# Patient Record
Sex: Female | Born: 1995 | Race: White | Hispanic: No | Marital: Single | Smoking: Never smoker
Health system: Southern US, Community
[De-identification: ages and names within clinical notes are randomized; demographics above are authoritative.]

## PROBLEM LIST (undated history)

## (undated) DIAGNOSIS — S43006A Unspecified dislocation of unspecified shoulder joint, initial encounter: Secondary | ICD-10-CM

## (undated) DIAGNOSIS — I493 Ventricular premature depolarization: Secondary | ICD-10-CM

## (undated) HISTORY — DX: Unspecified dislocation of unspecified shoulder joint, initial encounter: S43.006A

## (undated) HISTORY — PX: OTHER SURGICAL HISTORY: SHX169

---
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
## 2015-04-16 DIAGNOSIS — S43006A Unspecified dislocation of unspecified shoulder joint, initial encounter: Secondary | ICD-10-CM

## 2015-04-16 HISTORY — DX: Unspecified dislocation of unspecified shoulder joint, initial encounter: S43.006A

## 2015-09-16 IMAGING — CR RIGHT FOOT COMPLETE - 3+ VIEW
1 series · 4 of 4 positions shown · non-contrast
Comparison: None.

CLINICAL DATA: Pain in the ball the first MCP joint.

EXAM:
RIGHT FOOT COMPLETE - 3+ VIEW

[Series 1: ap · 0.17mm/px · 4 of 4 slices shown]
[im 1/4]
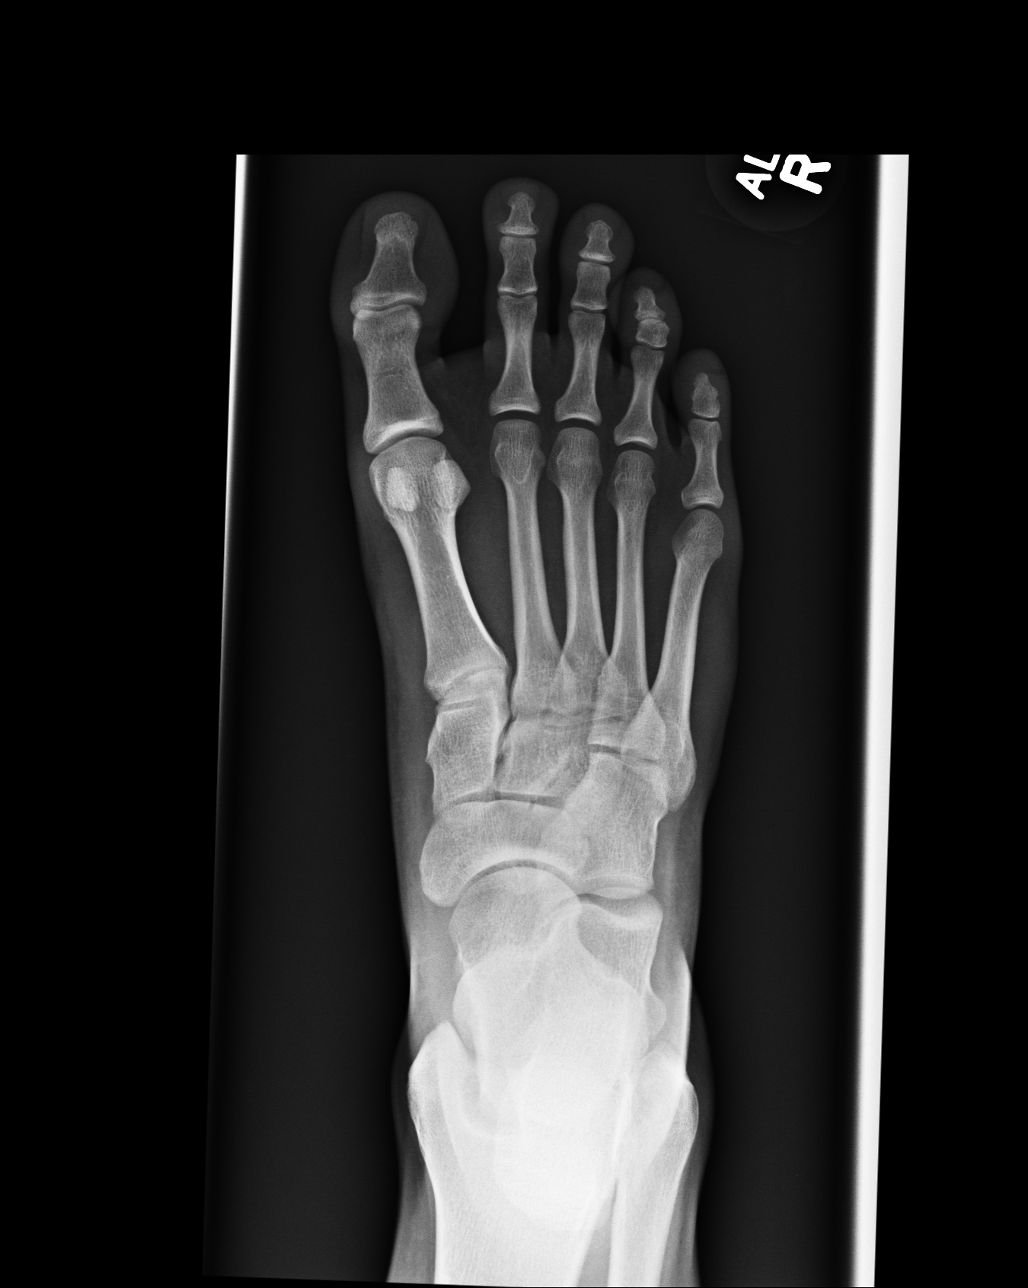
[im 2/4]
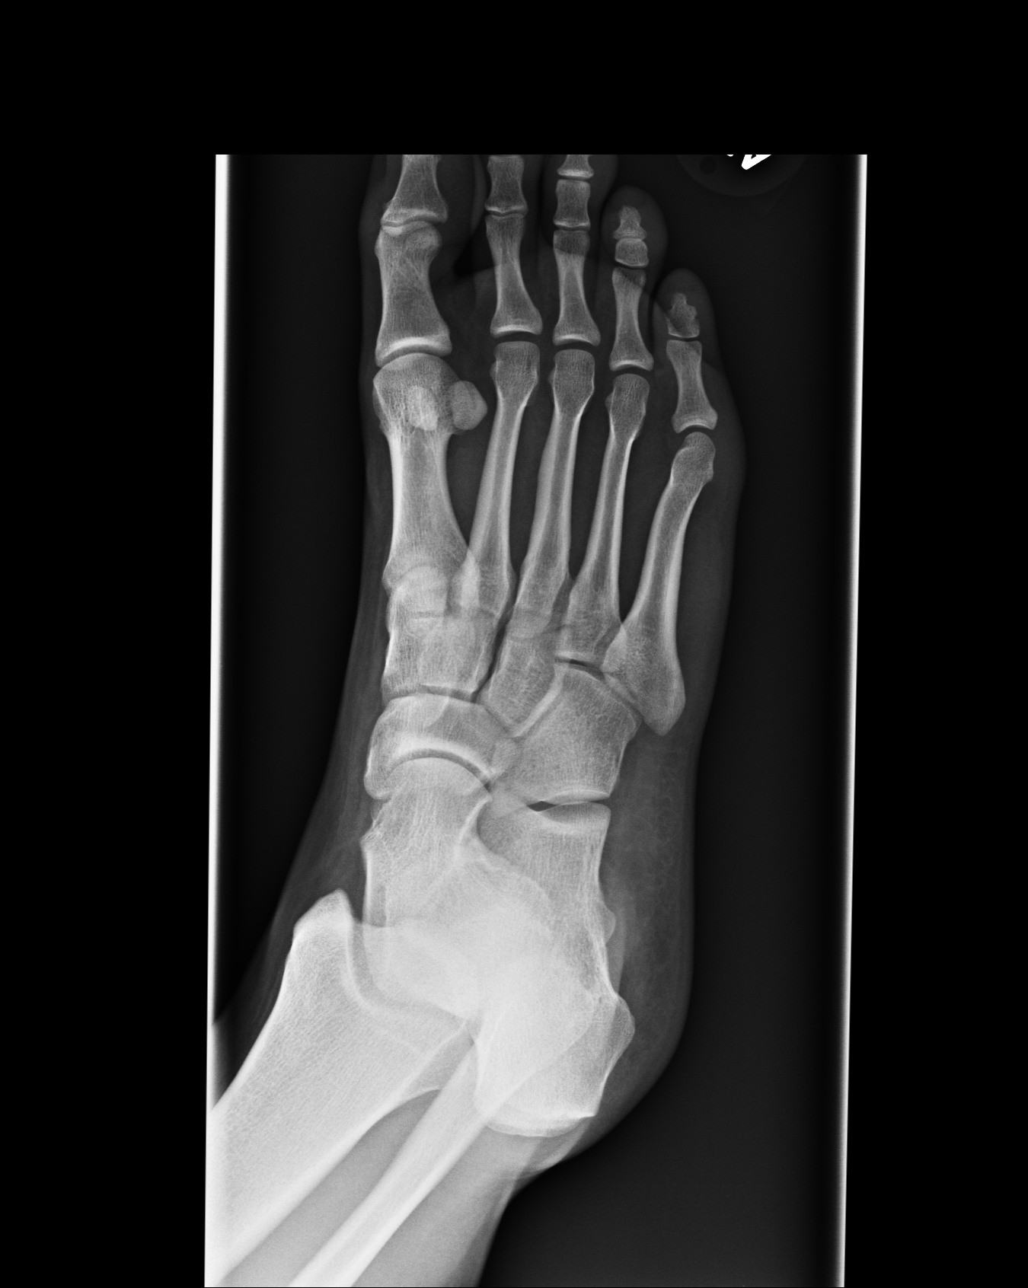
[im 3/4]
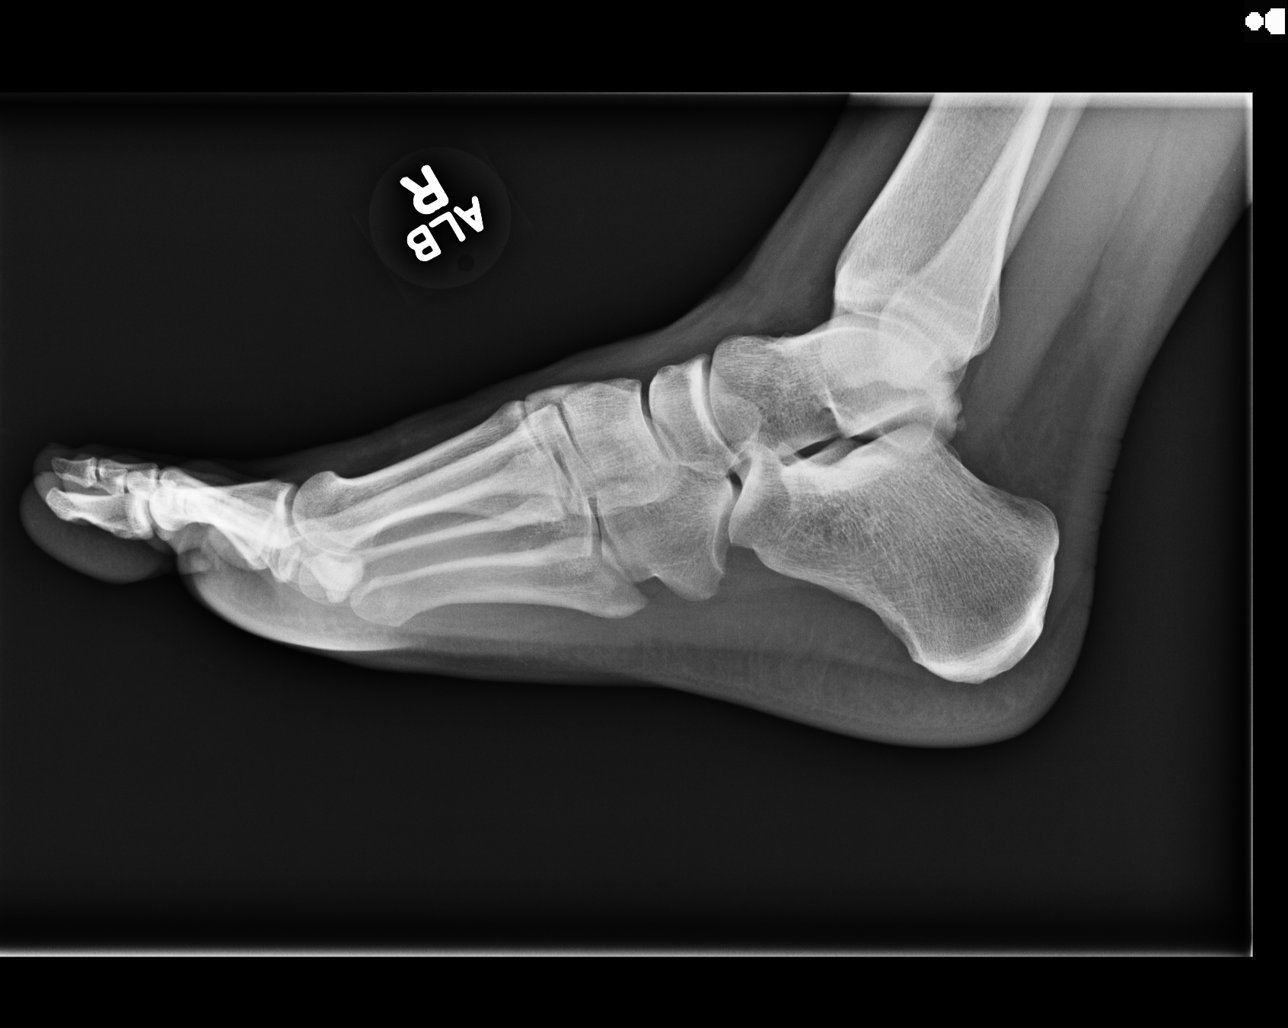
[im 4/4]
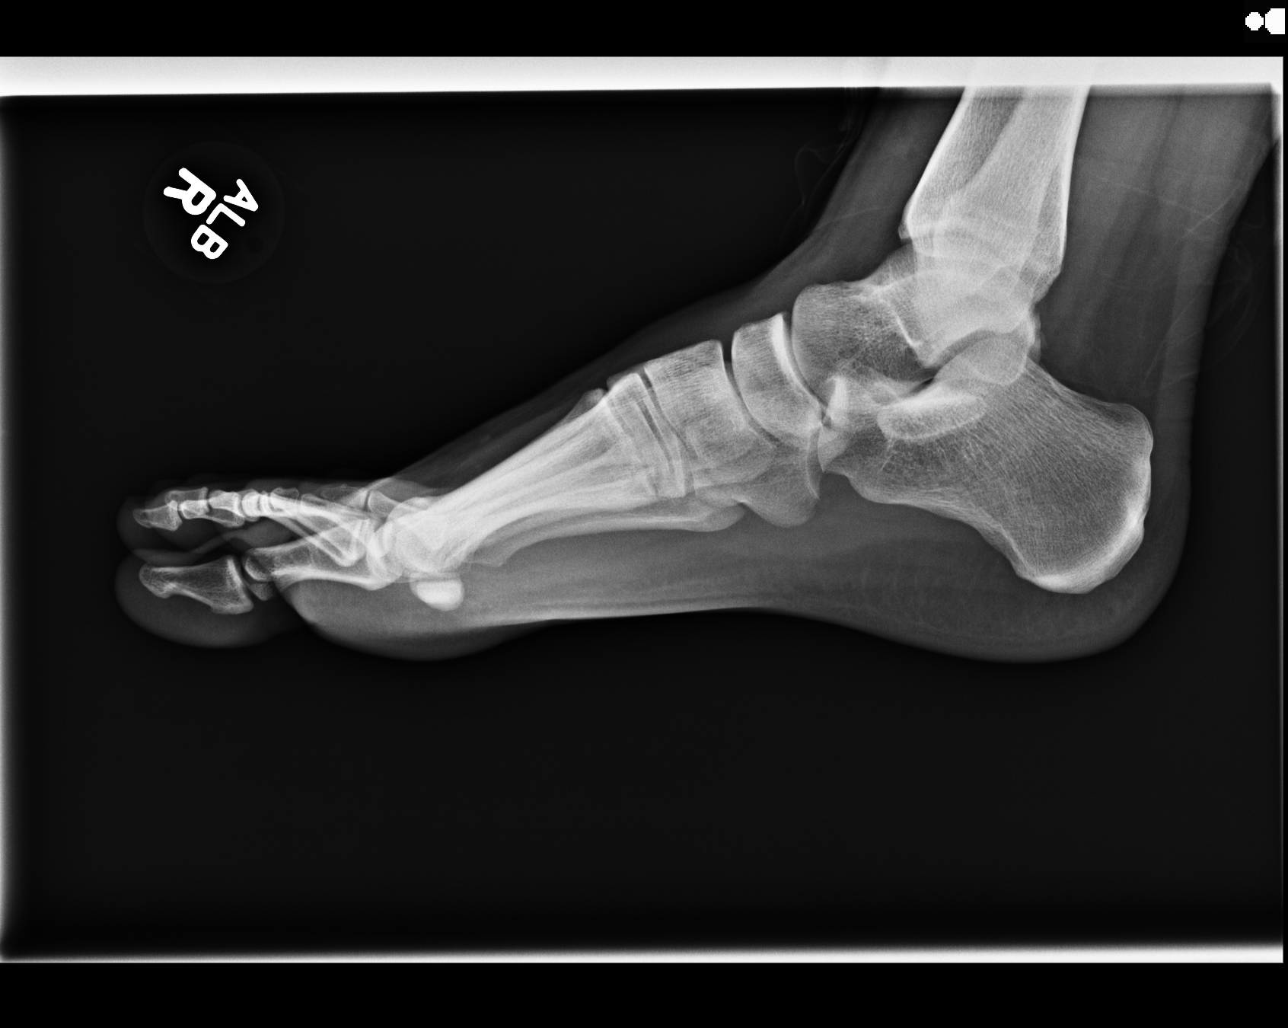

[4 of 4 positions shown; findings below may reference images not displayed]

FINDINGS: There is no evidence of fracture or dislocation. There is no
evidence of arthropathy or other focal bone abnormality. Soft
tissues are unremarkable.
IMPRESSION: Negative.

## 2016-05-16 HISTORY — PX: CARDIAC ELECTROPHYSIOLOGY STUDY AND ABLATION: SHX1294

## 2017-01-13 ENCOUNTER — Ambulatory Visit (HOSPITAL_COMMUNITY)
Admission: EM | Admit: 2017-01-13 | Discharge: 2017-01-13 | Disposition: A | Discharge status: Home / Self Care | Payer: BLUE CROSS/BLUE SHIELD | Attending: Internal Medicine | Admitting: Internal Medicine

## 2017-01-13 ENCOUNTER — Encounter (HOSPITAL_COMMUNITY): Payer: Self-pay | Admitting: Emergency Medicine

## 2017-01-13 DIAGNOSIS — J029 Acute pharyngitis, unspecified: Secondary | ICD-10-CM

## 2017-01-13 HISTORY — DX: Ventricular premature depolarization: I49.3

## 2017-01-13 MED ORDER — CLINDAMYCIN HCL 300 MG PO CAPS: 300.0000 mg | ORAL_CAPSULE | Freq: Three times a day (TID) | ORAL | 0 refills | Status: DC

## 2017-01-13 MED ORDER — MAGIC MOUTHWASH W/LIDOCAINE: 5.0000 mL | Freq: Three times a day (TID) | ORAL | 0 refills | Status: DC | PRN

## 2017-01-13 MED ORDER — DEXAMETHASONE SODIUM PHOSPHATE 10 MG/ML IJ SOLN
10.0000 mg | Freq: Once | INTRAMUSCULAR | Status: AC
  Administered 2017-01-13: 10 mg via INTRAMUSCULAR

## 2017-01-13 MED ORDER — DEXAMETHASONE SODIUM PHOSPHATE 10 MG/ML IJ SOLN
INTRAMUSCULAR | Status: AC
  Filled 2017-01-13: qty 1

## 2017-01-13 NOTE — Discharge Instructions (Signed)
You are 4/4 on the Centor Criteria, meaning you likely have strep throat and testing is not needed. I am starting you on clindamycin, take 1 tablet three times a day. For pain, magic mouthwash swish, gargle and swallow three times a day as needed. If symptoms persist, return to clinic as needed, or follow up with an ENT of your choice.

## 2017-01-13 NOTE — ED Triage Notes (Signed)
The [facility] with a complaint of a sore throat x 5 days. The patient's mother reported that her Gp had given her a "prevenetative" dose of amoxicillin that she has been taking for 3 days.

## 2017-01-13 NOTE — ED Provider Notes (Signed)
CSN: [id number]     Arrival date & time 01/13/17  1818 History   First MD Initiated Contact with Patient 01/13/17 1935     Chief Complaint  Patient presents with  . Sore Throat   (Consider location/radiation/quality/duration/timing/severity/associated sxs/prior Treatment) 21 year old female presents to clinic 3 day history of sore throat. Has frequent strep infections, states she's had 3 infections this past year, most recently treated with amoxicillin. She denies cough, has had fever, swollen lymph nodes, painful swallowing. Denies any other symptoms   The history is provided by the patient.  Sore Throat     Past Medical History:  Diagnosis Date  . PVC's (premature ventricular contractions)    Hx of   Past Surgical History:  Procedure Laterality Date  . CARDIAC ELECTROPHYSIOLOGY STUDY AND ABLATION  05/2016   History reviewed. No pertinent family history. Social History  Substance Use Topics  . Smoking status: Never Smoker  . Smokeless tobacco: Never Used  . Alcohol use Yes   OB History    No data available     Review of Systems  Constitutional: Negative.   HENT: Positive for sore throat and trouble swallowing. Negative for congestion, rhinorrhea, sinus pain and sinus pressure.   Respiratory: Negative.  Negative for cough.   Cardiovascular: Negative.   Gastrointestinal: Negative.   Musculoskeletal: Negative.   Skin: Negative.   Neurological: Negative.     Allergies  Patient has no known allergies.  Home Medications   Prior to Admission medications   Medication Sig Start Date End Date Taking? Authorizing Provider  amoxicillin (AMOXIL) 875 MG tablet Take 875 mg by mouth 2 (two) times daily.   Yes [provider]  ibuprofen (ADVIL,MOTRIN) 800 MG tablet Take 800 mg by mouth every 8 (eight) hours as needed.   Yes [provider]  clindamycin (CLEOCIN) 300 MG capsule Take 1 capsule (300 mg total) by mouth 3 (three) times daily. 01/13/17   Dorena BodoKennard,  Latrise Bowland, NP  magic mouthwash w/lidocaine SOLN Take 5 mLs by mouth 3 (three) times daily as needed for mouth pain. 01/13/17   Dorena BodoKennard, Cadell Gabrielson, NP   Meds Ordered and Administered this Visit   Medications  dexamethasone (DECADRON) injection 10 mg (10 mg Intramuscular Given 01/13/17 1947)    BP 124/83 (BP Location: Left Arm)   Pulse 91   Temp 98.5 F (36.9 C) (Oral)   Resp 18   SpO2 100%  No data found.   Physical Exam  Constitutional: She is oriented to person, place, and time. She appears well-developed and well-nourished. No distress.  HENT:  Head: Normocephalic and atraumatic.  Right Ear: External ear normal.  Left Ear: External ear normal.  Noted exudative tonsillitis, grade 3 out of 4, with tender cervical lymphadenopathy, tender tonsillar Lymphadenopathy, and also submandibular lymphadenopathy. No evidence of peritonsillar abscess seen  Eyes: Conjunctivae are normal.  Neck: Normal range of motion.  Cardiovascular: Normal rate and regular rhythm.   Pulmonary/Chest: Effort normal and breath sounds normal.  Neurological: She is alert and oriented to person, place, and time.  Skin: Skin is warm and dry. Capillary refill takes less than 2 seconds. No rash noted. She is not diaphoretic. No erythema.  Psychiatric: She has a normal mood and affect. Her behavior is normal.  Nursing note and vitals reviewed.   Urgent Care Course     Procedures (including critical care time)  Labs Review Labs Reviewed - No data to display  Imaging Review No results found.     MDM  1. Pharyngitis, unspecified etiology      Centor Criteria   yes :Exudative Tonsillitis  no :Presence of Cough yes :Hx of Fever yes :Tender cervical lymph adenopathy no :Age less than 14 (+1) or over 44 (-1)  Total 4/4  Treating presumptively for strep pharyngitis, patient has failed outpatient therapy on amoxicillin, treating with clindamycin, Magic mouthwash, follow-up with primary care, ear nose  and throat, or return to clinic as needed.    Dorena Bodo, NP 01/13/17 2128

## 2017-10-25 ENCOUNTER — Ambulatory Visit (INDEPENDENT_AMBULATORY_CARE_PROVIDER_SITE_OTHER): Payer: BLUE CROSS/BLUE SHIELD | Admitting: Maternal Newborn

## 2017-10-25 ENCOUNTER — Encounter: Payer: Self-pay | Admitting: Maternal Newborn

## 2017-10-25 VITALS — BP 120/80 | HR 66 | Ht 65.0 in | Wt 179.0 lb

## 2017-10-25 DIAGNOSIS — N898 Other specified noninflammatory disorders of vagina: Secondary | ICD-10-CM

## 2017-10-25 DIAGNOSIS — Z124 Encounter for screening for malignant neoplasm of cervix: Secondary | ICD-10-CM | POA: Diagnosis not present

## 2017-10-25 DIAGNOSIS — Z01419 Encounter for gynecological examination (general) (routine) without abnormal findings: Secondary | ICD-10-CM | POA: Diagnosis not present

## 2017-10-25 NOTE — Progress Notes (Signed)
Gynecology Annual Exam  PCP: Patient, No Pcp Per  Chief Complaint:  Chief Complaint  Patient presents with  . Gynecologic Exam    weird d/c; std w/o neg - told to see gyn    History of Present Illness: Patient is a 22 y.o. G0P0000 presents for annual exam. The patient has increased vaginal discharge with a metallic odor.   LMP: Patient's last menstrual period was 10/17/2017. Menarche:13 Average Interval: irregular with Nexplanon Duration of flow: 5 days Heavy Menses: no Clots: no Intermenstrual Bleeding: no Postcoital Bleeding: no Dysmenorrhea: no  The patient is not currently sexually active. She currently uses Nexplanon for contraception. The patient does not perform self breast exams.  There is no notable family history of breast or ovarian cancer in her family.  The patient has regular exercise: yes.    Review of Systems  Constitutional: Negative.   HENT: Positive for congestion.   Eyes: Negative.   Respiratory: Positive for cough. Negative for shortness of breath and wheezing.   Cardiovascular: Negative for chest pain and palpitations.  Gastrointestinal: Negative for abdominal pain, constipation, diarrhea, heartburn and nausea.  Genitourinary: Negative.   Musculoskeletal: Negative.   Skin: Negative.   Neurological: Negative.   Endo/Heme/Allergies: Negative.   Psychiatric/Behavioral: Negative for depression. The patient is not nervous/anxious.   All other systems reviewed and are negative.   Past Medical History:  Past Medical History:  Diagnosis Date  . PVC's (premature ventricular contractions)    Hx of    Past Surgical History:  Past Surgical History:  Procedure Laterality Date  . CARDIAC ELECTROPHYSIOLOGY STUDY AND ABLATION  05/2016    Gynecologic History:  Patient's last menstrual period was 10/17/2017. Contraception: Nexplanon  Last Pap: No previous Pap Obstetric History: G0P0000  Family History:  No family history on file.  Social  History:  Social History   Socioeconomic History  . Marital status: Single    Spouse name: Not on file  . Number of children: 0  . Years of education: 54  . Highest education level: Not on file  Occupational History  . Occupation: unemployed  Social Needs  . Financial resource strain: Not on file  . Food insecurity:    Worry: Not on file    Inability: Not on file  . Transportation needs:    Medical: Not on file    Non-medical: Not on file  Tobacco Use  . Smoking status: Never Smoker  . Smokeless tobacco: Never Used  Substance and Sexual Activity  . Alcohol use: Yes  . Drug use: No  . Sexual activity: Not Currently    Birth control/protection: Implant  Lifestyle  . Physical activity:    Days per week: Not on file    Minutes per session: Not on file  . Stress: Not on file  Relationships  . Social connections:    Talks on phone: Not on file    Gets together: Not on file    Attends religious service: Not on file    Active member of club or organization: Not on file    Attends meetings of clubs or organizations: Not on file    Relationship status: Not on file  . Intimate partner violence:    Fear of current or ex partner: Not on file    Emotionally abused: Not on file    Physically abused: Not on file    Forced sexual activity: Not on file  Other Topics Concern  . Not on file  Social History Narrative  .  Not on file    Allergies:  No Known Allergies  Medications: Prior to Admission medications   Medication Sig Start Date End Date Taking? Authorizing Provider  etonogestrel (NEXPLANON) 68 MG IMPL implant Inject 1 Device into the skin continuous.   Yes [provider]  ibuprofen (ADVIL,MOTRIN) 800 MG tablet Take 800 mg by mouth every 8 (eight) hours as needed.   Yes [provider]  loratadine (CLARITIN) 10 MG tablet Take 10 mg by mouth daily.   Yes [provider]    Physical Exam Vitals: Blood pressure 120/80, pulse 66, height 5\' 5"   (1.651 m), weight 179 lb (81.2 kg), last menstrual period 10/17/2017.  General: NAD HEENT: normocephalic, anicteric Thyroid: no enlargement, no palpable nodules Pulmonary: No increased work of breathing, CTAB Cardiovascular: RRR Breast: Breasts symmetrical, no tenderness, no palpable nodules or masses, no skin or nipple retraction present, no nipple discharge.  No axillary or supraclavicular lymphadenopathy. Abdomen: soft, non-tender, non-distended.  Umbilicus without lesions.  No hepatomegaly, splenomegaly or masses palpable. No evidence of hernia  Genitourinary:  External: Normal external female genitalia.  Normal urethral  meatus, normal Bartholin's and Skene's glands.    Vagina: Normal vaginal mucosa, no evidence of prolapse.    Cervix: Grossly normal in appearance, no bleeding  Uterus: Non-enlarged, mobile, normal contour.  No CMT  Adnexa: ovaries non-enlarged, no adnexal masses  Rectal: deferred  Lymphatic: no evidence of inguinal lymphadenopathy Extremities: no edema, erythema, or tenderness Neurologic: Grossly intact Psychiatric: mood appropriate, affect full  Assessment: 22 y.o. G0P0000 routine annual exam with increased discharge/metallic odor.  Plan: Problem List Items Addressed This Visit    None    Visit Diagnoses    Women's annual routine gynecological examination    -  Primary   Pap smear for cervical cancer screening       Relevant Orders   Pap IG w/ reflex to HPV when ASC-U   Vaginal discharge       Relevant Orders   NuSwab Vaginitis Plus (VG+)      1) Gardasil Series discussed and if applicable offered to patient - Patient has previously completed 3 shot series   2) STI screening was offered and declined, recently had negative screening and has not been sexually active since then.  3) ASCCP guidelines and rationale discussed.  Patient opts for every 3 year screening interval if today's result is normal.  4) Contraception - Education given regarding  options for contraception, including IUD placement, Nexplanon.  Patient is interested in an IUD after her Nexplanon expires in September 2019. Brochure given for [place] IUD.  5) NuSwab for vaginal discharge and odor, will send Rx as needed. Discussed probiotics to help maintain healthy vaginal flora.  6) Follow up 1 year for routine annual exam.  Marcelyn BruinsJacelyn Fredric Slabach, CNM 10/25/2017  4:42 PM

## 2017-10-29 LAB — NUSWAB VAGINITIS PLUS (VG+)
ATOPOBIUM VAGINAE: HIGH {score} — AB
BVAB 2: HIGH {score} — AB
CANDIDA GLABRATA, NAA: NEGATIVE
Candida albicans, NAA: NEGATIVE
Chlamydia trachomatis, NAA: NEGATIVE
Megasphaera 1: HIGH Score — AB
Neisseria gonorrhoeae, NAA: NEGATIVE
Trich vag by NAA: NEGATIVE

## 2017-10-30 ENCOUNTER — Other Ambulatory Visit: Payer: Self-pay | Admitting: Maternal Newborn

## 2017-10-30 DIAGNOSIS — N760 Acute vaginitis: Principal | ICD-10-CM

## 2017-10-30 DIAGNOSIS — B9689 Other specified bacterial agents as the cause of diseases classified elsewhere: Secondary | ICD-10-CM

## 2017-10-30 LAB — PAP IG W/ RFLX HPV ASCU: PAP SMEAR COMMENT: 0

## 2017-10-30 MED ORDER — METRONIDAZOLE 500 MG PO TABS: 500.0000 mg | ORAL_TABLET | Freq: Two times a day (BID) | ORAL | 0 refills | Status: AC

## 2017-10-30 NOTE — Progress Notes (Signed)
Rx for Flagyl sent to [facility] in [place].  Marcelyn BruinsJacelyn Schmid, CNM 10/30/2017  6:39 PM

## 2018-03-27 ENCOUNTER — Ambulatory Visit: Payer: BLUE CROSS/BLUE SHIELD | Admitting: Maternal Newborn

## 2018-03-28 ENCOUNTER — Ambulatory Visit (INDEPENDENT_AMBULATORY_CARE_PROVIDER_SITE_OTHER): Payer: BLUE CROSS/BLUE SHIELD | Admitting: Maternal Newborn

## 2018-03-28 ENCOUNTER — Encounter: Payer: Self-pay | Admitting: Maternal Newborn

## 2018-03-28 VITALS — BP 120/80 | Ht 65.0 in | Wt 172.0 lb

## 2018-03-28 DIAGNOSIS — Z3046 Encounter for surveillance of implantable subdermal contraceptive: Secondary | ICD-10-CM

## 2018-03-28 DIAGNOSIS — Z3049 Encounter for surveillance of other contraceptives: Secondary | ICD-10-CM | POA: Diagnosis not present

## 2018-04-01 ENCOUNTER — Encounter: Payer: Self-pay | Admitting: Maternal Newborn

## 2018-04-01 NOTE — Progress Notes (Signed)
     GYNECOLOGY PROCEDURE NOTE  Nexplanon removal discussed in detail.  Risks of infection, bleeding, nerve injury reviewed.  Patient understands risks and desires to proceed.  Verbal consent obtained.  Patient is certain she wants the Nexplanon removed.  All questions answered.  Procedure: Patient placed in dorsal supine with left arm above head, elbow flexed at 90 degrees, arm resting on examination table.  Nexplanon identified without problems.  Betadine scrub x3.  1 ml of 1% lidocaine was injected under the device without problems.  Sterile gloves applied.  Small 0.5cm incision made at distal tip of Nexplanon device with 11 blade scalpel.  Device was brought to the incision and grasped with a small kelly clamp. Nexplanon removed intact without problems.  Pressure was applied to the incision site.  Hemostasis obtained.  Steri-strips applied, followed by bandage and compression dressing.  Patient tolerated the procedure well.  No complications.  Assessment: 22 y.o. year old female now s/p uncomplicated Nexplanon removal.  Plan: 1.  Patient given post procedure precautions and asked to call for fever, chills, redness or drainage from her incision, bleeding from incision.  She understands she will likely have a small bruise near site of removal and can remove bandage tomorrow and steri-strips in approximately 1 week.  2) Contraception: she is not currently sexually active. Brief discussion of options and written material was given. She will contact the office when she has decided which method she prefers.  Marcelyn BruinsJacelyn Schmid, CNM 03/28/2018

## 2018-05-06 ENCOUNTER — Telehealth: Payer: Self-pay | Admitting: Maternal Newborn

## 2018-05-06 NOTE — Telephone Encounter (Signed)
10/25 at 130 for kyleena with JS

## 2018-05-09 ENCOUNTER — Ambulatory Visit (INDEPENDENT_AMBULATORY_CARE_PROVIDER_SITE_OTHER): Payer: BLUE CROSS/BLUE SHIELD | Admitting: Maternal Newborn

## 2018-05-09 ENCOUNTER — Encounter: Payer: Self-pay | Admitting: Maternal Newborn

## 2018-05-09 VITALS — BP 110/60 | HR 86 | Ht 65.0 in | Wt 168.0 lb

## 2018-05-09 DIAGNOSIS — Z30430 Encounter for insertion of intrauterine contraceptive device: Secondary | ICD-10-CM

## 2018-05-09 NOTE — Progress Notes (Signed)
  GYNECOLOGY OFFICE PROCEDURE NOTE  Tamara Dawson is a 22 y.o. G0P0000 here for a IUD insertion. No GYN concerns.  Last pap smear was on 10/25/2017 and was normal.  The patient is currently using abstinence for contraception and her LMP was 04/29/2018 (approximate). The indication for her IUD is contraception/cycle control.  IUD Insertion Procedure Note Patient identified, informed consent performed, consent signed.   Discussed risks of irregular bleeding, cramping, infection, malpositioning, expulsion or uterine perforation of the IUD (1:1000 placements)  which may require further procedure such as laparoscopy.   Speculum placed in the vagina.  Cervix visualized.  Cleaned with Betadine x 3.  Grasped anteriorly with a single tooth tenaculum.  Uterus sounded to 8 cm.  IUD placed per manufacturer's recommendations.  Strings trimmed to 3 cm. Tenaculum was removed, good hemostasis noted. She tolerated the procedure well.   Patient was given post-procedure instructions. Patient was asked to check IUD strings periodically and follow up in 6 weeks for IUD check.  Marcelyn Bruins, CNM [facility], [facility]

## 2018-05-18 ENCOUNTER — Ambulatory Visit
Admission: EM | Admit: 2018-05-18 | Discharge: 2018-05-18 | Disposition: A | Discharge status: Home / Self Care | Payer: BLUE CROSS/BLUE SHIELD

## 2018-05-18 DIAGNOSIS — L237 Allergic contact dermatitis due to plants, except food: Secondary | ICD-10-CM | POA: Diagnosis not present

## 2018-05-18 MED ORDER — METHYLPREDNISOLONE SODIUM SUCC 125 MG IJ SOLR
125.0000 mg | Freq: Once | INTRAMUSCULAR | Status: AC
  Administered 2018-05-18: 125 mg via INTRAMUSCULAR

## 2018-05-18 MED ORDER — PREDNISONE 10 MG (48) PO TBPK: ORAL_TABLET | ORAL | 0 refills | Status: DC

## 2018-05-18 NOTE — ED Triage Notes (Signed)
Pt here for a rash all over her body, started on her neck about Wednesday and has spread all over her body. Thinks it's poison ivy/oak and did cut down some bushes a week ago and thinks it's probably from that.

## 2018-05-18 NOTE — Discharge Instructions (Addendum)
You were given a steroid shot (SoluMedrol) today to help with itching and spreading of the rash. Recommend start Prednisone 10mg  tablets- take 6 tablets today and tomorrow and then decrease by 1 tablet every 2 days until finished on day 12. May use cool compresses and oatmeal baths to help with comfort. May also take OTC Benadryl 50mg  at night to help with itching. Follow-up here in 3 days if not improving.

## 2018-05-19 NOTE — ED Provider Notes (Signed)
[facility]    CSN: [id number] Arrival date & time: 05/18/18  1401     History   Chief Complaint Chief Complaint  Patient presents with  . Poison Ivy    HPI LEILANEE RIGHETTI is a 22 y.o. female.   22 year old female presents with rash that started on her neck about 5 to 6 days ago. She was doing yard work in [employer] and a T-shirt over 1 week ago when the weather was nice and was trimming bushes and may have been exposed to poison ivy or oak. Started with a few bumps in the anterior aspect of her neck and has spread to her lower arms and hands, lower abdomen, upper thighs, lower back and lower legs. No lesions on her face. Areas are very itchy, especially her thighs and she has scratched to where they have bled. She has tried applying hydrocortisone cream and taking Benadryl with minimal relief. No previous exposure to poison ivy/oak. No other chronic health issues. Has Kyleena IUD in place- no oral medications.   The history is provided by the patient.    Past Medical History:  Diagnosis Date  . 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 PVC's (premature ventricular contractions)    Hx of    There are no active problems to display for this patient.   Past Surgical History:  Procedure Laterality Date  . ARM SURGERY Left    4TH GRADE  . CARDIAC CATHETERIZATION  2012  . CARDIAC ELECTROPHYSIOLOGY STUDY AND ABLATION  05/2016    OB History    Gravida  0   Para  0   Term  0   Preterm  0   AB  0   Living  0     SAB  0   TAB  0   Ectopic  0   Multiple  0   Live Births  0            Home Medications    Prior to Admission medications   Medication Sig Start Date End Date Taking? Authorizing Provider  Levonorgestrel (KYLEENA) 19.5 MG IUD by Intrauterine route.   Yes [provider]  ibuprofen (ADVIL,MOTRIN) 800 MG tablet Take 800 mg by mouth every 8 (eight) hours as needed.     [provider]  predniSONE (STERAPRED UNI-PAK 48 TAB) 10 MG (48) TBPK tablet Take 6 tablets by mouth today and then decrease by 1 tablet (taper) every 2 days until finished on day 12. 05/18/18   Ajit Errico, Ali Lowe, NP    Family History Family History  Problem Relation Age of Onset  . Healthy Mother   . Healthy Father     Social History Social History   Tobacco Use  . Smoking status: Never Smoker  . Smokeless tobacco: Never Used  Substance Use Topics  . Alcohol use: Yes  . Drug use: No     Allergies   Patient has no known allergies.   Review of Systems Review of Systems  Constitutional: Negative for activity change, appetite change, chills, diaphoresis, fatigue and fever.  HENT: Negative for facial swelling, mouth sores, sore throat and trouble swallowing.   Eyes: Negative for photophobia, pain, discharge, redness, itching and visual disturbance.  Respiratory: Negative for cough, chest tightness, shortness of breath and wheezing.   Cardiovascular: Negative for chest pain and palpitations.  Gastrointestinal: Negative for abdominal pain, nausea and vomiting.  Musculoskeletal: Negative for arthralgias,  back pain, myalgias, neck pain and neck stiffness.  Skin: Positive for color change and rash. Negative for wound.  Allergic/Immunologic: Negative for food allergies and immunocompromised state.  Neurological: Negative for dizziness, tremors, seizures, syncope, facial asymmetry, speech difficulty, weakness, light-headedness, numbness and headaches.  Hematological: Negative for adenopathy. Does not bruise/bleed easily.  Psychiatric/Behavioral: Negative.      Physical Exam Triage Vital Signs ED Triage Vitals  Enc Vitals Group     BP 05/18/18 1414 126/81     Pulse Rate 05/18/18 1414 94     Resp 05/18/18 1414 18     Temp 05/18/18 1414 98.6 F (37 C)     Temp Source 05/18/18 1414 Oral     SpO2 05/18/18 1414 99 %     Weight 05/18/18 1415 165 lb (74.8 kg)     Height  --      Head Circumference --      Peak Flow --      Pain Score 05/18/18 1415 0     Pain Loc --      Pain Edu? --      Excl. in GC? --    No data found.  Updated Vital Signs BP 126/81 (BP Location: Left Arm)   Pulse 94   Temp 98.6 F (37 C) (Oral)   Resp 18   Wt 165 lb (74.8 kg)   LMP 03/21/2018   SpO2 99%   BMI 27.46 kg/m   Visual Acuity Right Eye Distance:   Left Eye Distance:   Bilateral Distance:    Right Eye Near:   Left Eye Near:    Bilateral Near:     Physical Exam  Constitutional: She is oriented to person, place, and time. Vital signs are normal. She appears well-developed and well-nourished. She is cooperative. She does not appear ill. No distress.  Patient sitting comfortably on exam table in no acute distress but is scratching at her thighs through her jeans.   HENT:  Head: Normocephalic and atraumatic.  Right Ear: Hearing and external ear normal.  Left Ear: Hearing and external ear normal.  Nose: Nose normal.  Mouth/Throat: Uvula is midline, oropharynx is clear and moist and mucous membranes are normal.  Eyes: Conjunctivae and EOM are normal.  Neck: Normal range of motion. Neck supple.  Cardiovascular: Normal rate, regular rhythm and normal heart sounds.  No murmur heard. Pulmonary/Chest: Effort normal and breath sounds normal. No stridor. No respiratory distress. She has no decreased breath sounds. She has no wheezes. She has no rhonchi. She has no rales.  Musculoskeletal: Normal range of motion.  Neurological: She is alert and oriented to person, place, and time.  Skin: Skin is warm, dry and intact. Capillary refill takes less than 2 seconds. Rash noted. No purpura noted. Rash is papular and urticarial. Rash is not pustular and not vesicular.     Extensive mainly papular with some urticarial lesions present on anterior central area of neck, upper chest, lower abdomen, lower arms and hands bilaterally, upper thighs, lower back and lower legs  bilaterally. Some excoriation present on upper thighs but no distinct signs of secondary bacterial infection. No crusting or discharge. No surrounding erythema.   Psychiatric: She has a normal mood and affect. Her behavior is normal. Judgment and thought content normal.  Vitals reviewed.    UC Treatments / Results  Labs (all labs ordered are listed, but only abnormal results are displayed) Labs Reviewed - No data to display  EKG None  Radiology No results  found.  Procedures Procedures (including critical care time)  Medications Ordered in UC Medications  methylPREDNISolone sodium succinate (SOLU-MEDROL) 125 mg/2 mL injection 125 mg (125 mg Intramuscular Given 05/18/18 1541)    Initial Impression / Assessment and Plan / UC Course  I have reviewed the triage vital signs and the nursing notes.  Pertinent labs & imaging results that were available during my care of the patient were reviewed by me and considered in my medical decision making (see chart for details).    Discussed with patient that she probably has contact dermatitis from plants. Gave SoluMedrol 125mg  IM now to help with itching and rash. Recommend start Prednisone 10mg  12 day dose pack as directed. May continue Benadryl 50mg  at night to help with itching. Apply cool compresses to area and soak in oatmeal baths. Follow-up here in 3 to 4 days if not improving.  Final Clinical Impressions(s) / UC Diagnoses   Final diagnoses:  Allergic contact dermatitis due to plants, except food     Discharge Instructions     You were given a steroid shot (SoluMedrol) today to help with itching and spreading of the rash. Recommend start Prednisone 10mg  tablets- take 6 tablets today and tomorrow and then decrease by 1 tablet every 2 days until finished on day 12. May use cool compresses and oatmeal baths to help with comfort. May also take OTC Benadryl 50mg  at night to help with itching. Follow-up here in 3 days if not improving.      ED Prescriptions    Medication Sig Dispense Auth. Provider   predniSONE (STERAPRED UNI-PAK 48 TAB) 10 MG (48) TBPK tablet Take 6 tablets by mouth today and then decrease by 1 tablet (taper) every 2 days until finished on day 12. 42 tablet Juliocesar Blasius, Ali Lowe, NP     Controlled Substance Prescriptions [place] Controlled Substance Registry consulted? Not Applicable   Sudie Grumbling, NP 05/19/18 [phone]

## 2018-09-01 ENCOUNTER — Other Ambulatory Visit: Payer: Self-pay

## 2018-09-01 ENCOUNTER — Ambulatory Visit
Admission: EM | Admit: 2018-09-01 | Discharge: 2018-09-01 | Disposition: A | Discharge status: Home / Self Care | Payer: BLUE CROSS/BLUE SHIELD | Attending: Family Medicine | Admitting: Family Medicine

## 2018-09-01 DIAGNOSIS — J020 Streptococcal pharyngitis: Secondary | ICD-10-CM | POA: Diagnosis not present

## 2018-09-01 LAB — RAPID STREP SCREEN (MED CTR MEBANE ONLY): Streptococcus, Group A Screen (Direct): POSITIVE — AB

## 2018-09-01 MED ORDER — AMOXICILLIN 875 MG PO TABS: 875.0000 mg | ORAL_TABLET | Freq: Two times a day (BID) | ORAL | 0 refills | Status: DC

## 2018-09-01 MED ORDER — DEXAMETHASONE SODIUM PHOSPHATE 10 MG/ML IJ SOLN: 10.0000 mg | Freq: Once | INTRAMUSCULAR | Status: DC

## 2018-09-01 NOTE — Discharge Instructions (Addendum)
Take medication as prescribed. Rest. Drink plenty of fluids.  ° °Follow up with your primary care physician this week as needed. Return to Urgent care for new or worsening concerns.  ° °

## 2018-09-01 NOTE — ED Provider Notes (Signed)
[facility] ____________________________________________  Time seen: Approximately 9:25 AM  I have reviewed the triage vital signs and the nursing notes.   HISTORY  Chief Complaint Sore Throat   HPI Tamara Dawson is a 23 y.o. female present with family at bedside for evaluation of sore throat for the last 2 days.  States sore throat is currently moderate, does cause pain to swallow.  Has continued to tolerate fluids well, decreased appetite.  Did have some mild nasal congestion this morning, no cough.  Possible low-grade fevers.  Did take ibuprofen.  States feels like strep.  Has a history of recurrent frequent strep throat infections, previously evaluated by ENT for consultation of possible tonsillectomy but did not schedule for surgery.  Reports otherwise doing well.  Denies chest pain or shortness of breath.  No recent antibiotic use.  Denies other recent sickness.  No LMP recorded. (Menstrual status: IUD).  Denies pregnancy.  Past Medical History:  Diagnosis Date  . 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 PVC's (premature ventricular contractions)    Hx of    There are no active problems to display for this patient.   Past Surgical History:  Procedure Laterality Date  . ARM SURGERY Left    4TH GRADE  . CARDIAC CATHETERIZATION  2012  . CARDIAC ELECTROPHYSIOLOGY STUDY AND ABLATION  05/2016      Current Facility-Administered Medications:  .  dexamethasone (DECADRON) injection 10 mg, 10 mg, Intramuscular, Once, Renford Dills, NP  Current Outpatient Medications:  .  Levonorgestrel (KYLEENA) 19.5 MG IUD, by Intrauterine route., Disp: , Rfl:  .  amoxicillin (AMOXIL) 875 MG tablet, Take 1 tablet (875 mg total) by mouth 2 (two) times daily., Disp: 20 tablet, Rfl: 0  Allergies Patient has no known allergies.  Family History  Problem Relation Age of Onset  . Healthy Mother   . Healthy Father      Social History Social History   Tobacco Use  . Smoking status: Never Smoker  . Smokeless tobacco: Never Used  Substance Use Topics  . Alcohol use: Yes    Comment: socially  . Drug use: No    Review of Systems Constitutional: Possible fever ENT: Positive sore throat. Cardiovascular: Denies chest pain. Respiratory: Denies shortness of breath. Gastrointestinal: No abdominal pain.  Musculoskeletal: Negative for back pain. Skin: Negative for rash.  ____________________________________________   PHYSICAL EXAM:  VITAL SIGNS: ED Triage Vitals  Enc Vitals Group     BP 09/01/18 0905 118/84     Pulse Rate 09/01/18 0905 (!) 108     Resp 09/01/18 0905 18     Temp 09/01/18 0905 99.6 F (37.6 C)     Temp Source 09/01/18 0905 Oral     SpO2 09/01/18 0905 99 %     Weight 09/01/18 0903 165 lb (74.8 kg)     Height 09/01/18 0903 5\' 6"  (1.676 m)     Head Circumference --      Peak Flow --      Pain Score 09/01/18 0903 8     Pain Loc --      Pain Edu? --      Excl. in GC? --     Constitutional: Alert and oriented. Well appearing and in no acute distress. Eyes: Conjunctivae are normal.  Head: Atraumatic. No sinus tenderness to palpation. No swelling. No erythema.  Ears: no erythema, normal TMs bilaterally.   Nose:slight nasal congestion   Mouth/Throat: Mucous  membranes are moist. Mild to moderate pharyngeal erythema. 2+ bilateral tonsillar swelling, with mild bilateral exudate.  No uvular shift or deviation. Neck: No stridor.  No cervical spine tenderness to palpation. Hematological/Lymphatic/Immunilogical: Bilateral cervical lymphadenopathy. Cardiovascular: Normal rate, regular rhythm. Grossly normal heart sounds.  Good peripheral circulation. Respiratory: Normal respiratory effort.  No retractions. No wheezes, rales or rhonchi. Good air movement.  Musculoskeletal: Ambulatory with steady gait.  Neurologic:  Normal speech and language. No gait instability. Skin:  Skin appears  warm, dry and intact. No rash noted. Psychiatric: Mood and affect are normal. Speech and behavior are normal. ___________________________________________   LABS (all labs ordered are listed, but only abnormal results are displayed)  Labs Reviewed  RAPID STREP SCREEN ([facility] ONLY) - Abnormal; Notable for the following components:      Result Value   Streptococcus, Group A Screen (Direct) POSITIVE (*)    All other components within normal limits     PROCEDURES Procedures   INITIAL IMPRESSION / ASSESSMENT AND PLAN / ED COURSE  Pertinent labs & imaging results that were available during my care of the patient were reviewed by me and considered in my medical decision making (see chart for details).  Well-appearing patient.  No acute distress.  Strep positive.  Discussed treatment options with patient.  Declined IM penicillin, will treat with oral amoxicillin.  Will also give 10 mg IM Decadron once in urgent care.  Encouraged rest, fluids, supportive care, over-the-counter Tylenol, ibuprofen, gargles and supportive care.Discussed indication, risks and benefits of medications with patient.  Discussed follow up with Primary care physician this week. Discussed follow up and return parameters including no resolution or any worsening concerns. Patient verbalized understanding and agreed to plan.   ____________________________________________   FINAL CLINICAL IMPRESSION(S) / ED DIAGNOSES  Final diagnoses:  Strep throat     ED Discharge Orders         Ordered    amoxicillin (AMOXIL) 875 MG tablet  2 times daily     09/01/18 0936           Note: This dictation was prepared with Dragon dictation along with smaller phrase technology. Any transcriptional errors that result from this process are unintentional.         Renford Dills, NP 09/01/18 1050

## 2018-09-01 NOTE — ED Triage Notes (Signed)
Patient complains of sore throat that started on Saturday. Patient states that she noticed the swelling and white spots yesterday. States that she has noticed lymph node swelling.

## 2018-11-09 ENCOUNTER — Ambulatory Visit
Admission: EM | Admit: 2018-11-09 | Discharge: 2018-11-09 | Disposition: A | Discharge status: Home / Self Care | Payer: BLUE CROSS/BLUE SHIELD | Attending: Family Medicine | Admitting: Family Medicine

## 2018-11-09 ENCOUNTER — Encounter: Payer: Self-pay | Admitting: Emergency Medicine

## 2018-11-09 ENCOUNTER — Other Ambulatory Visit: Payer: Self-pay

## 2018-11-09 DIAGNOSIS — R590 Localized enlarged lymph nodes: Secondary | ICD-10-CM | POA: Diagnosis not present

## 2018-11-09 DIAGNOSIS — R000 Tachycardia, unspecified: Secondary | ICD-10-CM

## 2018-11-09 DIAGNOSIS — J039 Acute tonsillitis, unspecified: Secondary | ICD-10-CM | POA: Diagnosis not present

## 2018-11-09 LAB — RAPID STREP SCREEN (MED CTR MEBANE ONLY): Streptococcus, Group A Screen (Direct): NEGATIVE

## 2018-11-09 MED ORDER — DEXAMETHASONE SODIUM PHOSPHATE 10 MG/ML IJ SOLN
10.0000 mg | Freq: Once | INTRAMUSCULAR | Status: AC
  Administered 2018-11-09: 10 mg via INTRAMUSCULAR

## 2018-11-09 MED ORDER — PENICILLIN G BENZATHINE 1200000 UNIT/2ML IM SUSP
1.2000 10*6.[IU] | Freq: Once | INTRAMUSCULAR | Status: AC
  Administered 2018-11-09: 12:00:00 1.2 10*6.[IU] via INTRAMUSCULAR

## 2018-11-09 MED ORDER — AMOXICILLIN-POT CLAVULANATE 875-125 MG PO TABS: 1.0000 | ORAL_TABLET | Freq: Two times a day (BID) | ORAL | 0 refills | Status: DC

## 2018-11-09 NOTE — ED Provider Notes (Signed)
[facility]    CSN: [id number] Arrival date & time: 11/09/18  1057     History   Chief Complaint Chief Complaint  Patient presents with  . Sore Throat   HPI  23 year old female presents with sore throat.  Patient reports that she developed sore throat on Thursday.  She reports that she has white exudate on both tonsils.  She reports lymphadenopathy.  No fever.  No sick contacts.  No medication interventions tried.  No known exacerbating or relieving factors.  No other complaints   PMH, Surgical Hx, Family Hx, Social History reviewed and updated as below.  PMH: Hx of ventricular tachycardia status post ablation, ADD, PVC's  Past Surgical History:  Procedure Laterality Date  . ARM SURGERY Left    4TH GRADE  . CARDIAC CATHETERIZATION  2012  . CARDIAC ELECTROPHYSIOLOGY STUDY AND ABLATION  05/2016    OB History    Gravida  0   Para  0   Term  0   Preterm  0   AB  0   Living  0     SAB  0   TAB  0   Ectopic  0   Multiple  0   Live Births  0            Home Medications    Prior to Admission medications   Medication Sig Start Date End Date Taking? Authorizing Provider  Levonorgestrel (KYLEENA) 19.5 MG IUD by Intrauterine route.   Yes [provider]  amoxicillin-clavulanate (AUGMENTIN) 875-125 MG tablet Take 1 tablet by mouth every 12 (twelve) hours. 11/09/18   Tommie Sams, DO    Family History Family History  Problem Relation Age of Onset  . Healthy Mother   . Healthy Father     Social History Social History   Tobacco Use  . Smoking status: Never Smoker  . Smokeless tobacco: Never Used  Substance Use Topics  . Alcohol use: Yes    Comment: socially  . Drug use: No     Allergies   Patient has no known allergies.   Review of Systems Review of Systems  Constitutional: Negative for fever.  HENT: Positive for sore throat.   Hematological: Positive for adenopathy.   Physical Exam Triage Vital Signs ED  Triage Vitals  Enc Vitals Group     BP 11/09/18 1112 124/88     Pulse Rate 11/09/18 1112 (!) 107     Resp 11/09/18 1112 14     Temp 11/09/18 1112 98.5 F (36.9 C)     Temp Source 11/09/18 1112 Oral     SpO2 11/09/18 1112 99 %     Weight 11/09/18 1109 170 lb (77.1 kg)     Height 11/09/18 1109 5\' 5"  (1.651 m)     Head Circumference --      Peak Flow --      Pain Score 11/09/18 1109 6     Pain Loc --      Pain Edu? --      Excl. in GC? --    Updated Vital Signs BP 124/88 (BP Location: Right Arm)   Pulse (!) 107   Temp 98.5 F (36.9 C) (Oral)   Resp 14   Ht 5\' 5"  (1.651 m)   Wt 77.1 kg   SpO2 99%   BMI 28.29 kg/m   Visual Acuity Right Eye Distance:   Left Eye Distance:   Bilateral Distance:    Right Eye Near:   Left  Eye Near:    Bilateral Near:     Physical Exam Vitals signs and nursing note reviewed.  Constitutional:      General: She is not in acute distress.    Appearance: Normal appearance.  HENT:     Head: Normocephalic and atraumatic.     Right Ear: Tympanic membrane normal.     Left Ear: Tympanic membrane normal.     Mouth/Throat:     Comments: Oropharynx with moderate erythema.  Bilateral tonsillar exudates noted. Eyes:     General:        Right eye: No discharge.        Left eye: No discharge.     Conjunctiva/sclera: Conjunctivae normal.  Cardiovascular:     Rate and Rhythm: Regular rhythm. Tachycardia present.  Pulmonary:     Effort: Pulmonary effort is normal.     Breath sounds: Normal breath sounds.  Neurological:     Mental Status: She is alert.  Psychiatric:        Mood and Affect: Mood normal.        Behavior: Behavior normal.    UC Treatments / Results  Labs (all labs ordered are listed, but only abnormal results are displayed) Labs Reviewed  RAPID STREP SCREEN ([facility] ONLY)  CULTURE, GROUP A STREP [facility]    EKG None  Radiology No results found.  Procedures Procedures (including critical care time)  Medications  Ordered in UC Medications  dexamethasone (DECADRON) injection 10 mg (has no administration in time range)  penicillin g benzathine (BICILLIN LA) 1200000 UNIT/2ML injection 1.2 Million Units (1.2 Million Units Intramuscular Given 11/09/18 1146)    Initial Impression / Assessment and Plan / UC Course  I have reviewed the triage vital signs and the nursing notes.  Pertinent labs & imaging results that were available during my care of the patient were reviewed by me and considered in my medical decision making (see chart for details).    23 year old female presents with exudative tonsillitis.  Patient states that she does not do well with oral antibiotics.  Patient requested injectable medications.  Decadron and penicillin given.     Final Clinical Impressions(s) / UC Diagnoses   Final diagnoses:  Exudative tonsillitis   Controlled Substance Prescriptions [place] Controlled Substance Registry consulted? Not Applicable   Tommie SamsCook, Ellyse Rotolo G, DO 11/09/18 1207

## 2018-11-09 NOTE — Discharge Instructions (Signed)
Antibiotic as prescribed while awaiting culture.  Take care  Dr. Houa Nie  

## 2018-11-09 NOTE — ED Triage Notes (Signed)
Patient c/o sore throat since Thursday. Patient denies fevers.   

## 2018-11-12 LAB — CULTURE, GROUP A STREP (THRC)

## 2018-12-29 ENCOUNTER — Other Ambulatory Visit: Payer: Self-pay

## 2018-12-29 ENCOUNTER — Ambulatory Visit
Admission: EM | Admit: 2018-12-29 | Discharge: 2018-12-29 | Disposition: A | Discharge status: Home / Self Care | Payer: BLUE CROSS/BLUE SHIELD | Attending: Emergency Medicine | Admitting: Emergency Medicine

## 2018-12-29 ENCOUNTER — Encounter: Payer: Self-pay | Admitting: Emergency Medicine

## 2018-12-29 DIAGNOSIS — J029 Acute pharyngitis, unspecified: Secondary | ICD-10-CM | POA: Diagnosis not present

## 2018-12-29 DIAGNOSIS — K121 Other forms of stomatitis: Secondary | ICD-10-CM | POA: Diagnosis not present

## 2018-12-29 LAB — RAPID STREP SCREEN (MED CTR MEBANE ONLY): Streptococcus, Group A Screen (Direct): NEGATIVE

## 2018-12-29 MED ORDER — LIDOCAINE VISCOUS HCL 2 % MT SOLN: 15.0000 mL | OROMUCOSAL | 0 refills | Status: DC | PRN

## 2018-12-29 NOTE — ED Provider Notes (Signed)
[facility]    CSN: [id number] Arrival date & time: 12/29/18  1724     History   Chief Complaint Chief Complaint  Patient presents with  . Sore Throat    HPI Tamara Dawson is a 23 y.o. female presenting with throat pain x 48 hrs. Pt reports that pain isn't "that bad", but feels the worse when she is using a straw. Pt "just wants to make sure that she ist contagious". Pt noted to have multiple visits for sore throat in past 180 days. Pt has since followed up with an ENT, but they will not remove her tonsils until she has had a postitive occupance of strep 5 times. Pt has not used any OTC meds ir home remises to help pain. Denies hx of mono or HSV.    Past Medical History:  Diagnosis Date  . 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 PVC's (premature ventricular contractions)    Hx of    There are no active problems to display for this patient.   Past Surgical History:  Procedure Laterality Date  . ARM SURGERY Left    4TH GRADE  . CARDIAC CATHETERIZATION  2012  . CARDIAC ELECTROPHYSIOLOGY STUDY AND ABLATION  05/2016    OB History    Gravida  0   Para  0   Term  0   Preterm  0   AB  0   Living  0     SAB  0   TAB  0   Ectopic  0   Multiple  0   Live Births  0            Home Medications    Prior to Admission medications   Medication Sig Start Date End Date Taking? Authorizing Provider  Levonorgestrel (KYLEENA) 19.5 MG IUD by Intrauterine route.   Yes [provider]  amoxicillin-clavulanate (AUGMENTIN) 875-125 MG tablet Take 1 tablet by mouth every 12 (twelve) hours. 11/09/18   Tommie Samsook, Jayce G, DO  lidocaine (XYLOCAINE) 2 % solution Use as directed 15 mLs in the mouth or throat as needed for mouth pain (use Q-tip to apply to affected area). 12/29/18   Bailey MechBenjamin, Bryanne Riquelme, NP    Family History Family History  Problem Relation Age of Onset  . Healthy Mother   . Healthy Father      Social History Social History   Tobacco Use  . Smoking status: Never Smoker  . Smokeless tobacco: Never Used  Substance Use Topics  . Alcohol use: Yes    Comment: socially  . Drug use: No     Allergies   Patient has no known allergies.   Review of Systems Review of Systems  Constitutional: Negative for activity change and appetite change.  HENT: Positive for sore throat. Negative for congestion, ear pain, mouth sores, postnasal drip, sinus pressure and sinus pain.   Eyes: Negative for pain.  All other systems reviewed and are negative.    Physical Exam Triage Vital Signs ED Triage Vitals  Enc Vitals Group     BP 12/29/18 1748 116/77     Pulse Rate 12/29/18 1748 67     Resp 12/29/18 1748 18     Temp 12/29/18 1748 98.2 F (36.8 C)     Temp Source 12/29/18 1748 Oral     SpO2 12/29/18 1748 99 %     Weight 12/29/18 1747 170 lb (77.1 kg)  Height 12/29/18 1747 5\' 5"  (1.651 m)     Head Circumference --      Peak Flow --      Pain Score 12/29/18 1747 7     Pain Loc --      Pain Edu? --      Excl. in [place]? --    No data found.  Updated Vital Signs BP 116/77 (BP Location: Right Arm)   Pulse 67   Temp 98.2 F (36.8 C) (Oral)   Resp 18   Ht 5\' 5"  (1.651 m)   Wt 170 lb (77.1 kg)   SpO2 99%   BMI 28.29 kg/m      Physical Exam Vitals signs and nursing note reviewed.  HENT:     Mouth/Throat:     Mouth: No oral lesions.     Pharynx: Posterior oropharyngeal erythema present. No oropharyngeal exudate.     Tonsils: No tonsillar exudate or tonsillar abscesses.     Comments: herpetic appearing ulcer to hard palette  Neurological:     Mental Status: She is alert.  Psychiatric:        Mood and Affect: Mood normal.        Behavior: Behavior normal.      UC Treatments / Results  Labs (all labs ordered are listed, but only abnormal results are displayed) Labs Reviewed  RAPID STREP SCREEN ([facility] ONLY)  CULTURE, GROUP A STREP [facility])    EKG  None  Radiology No results found.  Procedures Procedures (including critical care time)  Medications Ordered in UC Medications - No data to display  Initial Impression / Assessment and Plan / UC Course  I have reviewed the triage vital signs and the nursing notes.  Pertinent labs & imaging results that were available during my care of the patient were reviewed by me and considered in my medical decision making (see chart for details).     Pt present with complaint of sore throat x 2 days. Rapid strep done in office negative; culture sent out. Upon examination, pt noted to have a small viral ulcer to roof of mouth (hard palette). Pt treated for viral pharyngitis with herpetic ulcer. Pt to follow-up with ENT if sore throat symptoms continue/worsen.   Final Clinical Impressions(s) / UC Diagnoses   Final diagnoses:  Viral pharyngitis  Hard palate ulcer   Discharge Instructions   None    ED Prescriptions    Medication Sig Dispense Auth. Provider   lidocaine (XYLOCAINE) 2 % solution Use as directed 15 mLs in the mouth or throat as needed for mouth pain (use Q-tip to apply to affected area). 15 mL Gertie Baron, NP        Gertie Baron, NP 12/29/18 [phone]

## 2018-12-29 NOTE — ED Triage Notes (Signed)
Patient c/o sore throat that started on Saturday. Denies fever.

## 2019-01-01 LAB — CULTURE, GROUP A STREP (THRC)

## 2020-12-12 ENCOUNTER — Other Ambulatory Visit: Payer: Self-pay

## 2020-12-12 ENCOUNTER — Ambulatory Visit
Admission: EM | Admit: 2020-12-12 | Discharge: 2020-12-12 | Disposition: A | Discharge status: Home / Self Care | Payer: 59 | Attending: Emergency Medicine | Admitting: Emergency Medicine

## 2020-12-12 DIAGNOSIS — R5383 Other fatigue: Secondary | ICD-10-CM | POA: Diagnosis not present

## 2020-12-12 DIAGNOSIS — J039 Acute tonsillitis, unspecified: Secondary | ICD-10-CM

## 2020-12-12 DIAGNOSIS — R509 Fever, unspecified: Secondary | ICD-10-CM | POA: Diagnosis present

## 2020-12-12 DIAGNOSIS — Z20822 Contact with and (suspected) exposure to covid-19: Secondary | ICD-10-CM | POA: Diagnosis not present

## 2020-12-12 LAB — RESP PANEL BY RT-PCR (FLU A&B, COVID) ARPGX2
Influenza A by PCR: NEGATIVE
Influenza B by PCR: NEGATIVE
SARS Coronavirus 2 by RT PCR: NEGATIVE

## 2020-12-12 LAB — GROUP A STREP BY PCR: Group A Strep by PCR: NOT DETECTED

## 2020-12-12 MED ORDER — LIDOCAINE VISCOUS HCL 2 % MT SOLN: 15.0000 mL | OROMUCOSAL | 0 refills | Status: AC | PRN

## 2020-12-12 MED ORDER — TRAMADOL HCL 50 MG PO TABS: 50.0000 mg | ORAL_TABLET | Freq: Four times a day (QID) | ORAL | 0 refills | Status: AC | PRN

## 2020-12-12 MED ORDER — AMOXICILLIN-POT CLAVULANATE 875-125 MG PO TABS: 1.0000 | ORAL_TABLET | Freq: Two times a day (BID) | ORAL | 0 refills | Status: AC

## 2020-12-12 NOTE — ED Triage Notes (Signed)
Patient states that she started having a fever with a sore throat , fatigue and body aches since yesterday. States that she is concerned she could have covid, the flu or strep. States that this is the worst her throat has ever felt.

## 2020-12-12 NOTE — Discharge Instructions (Addendum)
Take the Augmentin twice daily with food for 10 days for treatment of your tonsillitis.  Use the viscous lidocaine, 15 mL 4 times a day as needed for pain relief.  Take over-the-counter ibuprofen according to the package instructions as needed for pain and fever.  Use the tramadol as needed for severe pain.  If you develop any new or worsening symptoms return for reevaluation or go to the ER.

## 2020-12-12 NOTE — ED Provider Notes (Signed)
[facility]    CSN: [id number] Arrival date & time: 12/12/20  1841      History   Chief Complaint Chief Complaint  Patient presents with  . Fever    HPI Tamara Dawson is a 25 y.o. female.   HPI   25 year old female here for evaluation of sore throat, fever, body aches, and fatigue.  Patient reports that her symptoms started yesterday.  She is concerned that she may have COVID, flu, or strep.  She reports that this is the worst her sore throat has ever been.  She denies runny nose nasal congestion, cough or shortness of breath, abdominal pain, nausea, or vomiting.  Patient not been around anyone with similar symptoms, denies recent travel, denies recent unprotected oral sex, and denies sharing food or beverages with anyone.  Past Medical History:  Diagnosis Date  . 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 PVC's (premature ventricular contractions)    Hx of    There are no problems to display for this patient.   Past Surgical History:  Procedure Laterality Date  . ARM SURGERY Left    4TH GRADE  . CARDIAC CATHETERIZATION  2012  . CARDIAC ELECTROPHYSIOLOGY STUDY AND ABLATION  05/2016    OB History    Gravida  0   Para  0   Term  0   Preterm  0   AB  0   Living  0     SAB  0   IAB  0   Ectopic  0   Multiple  0   Live Births  0            Home Medications    Prior to Admission medications   Medication Sig Start Date End Date Taking? Authorizing Provider  amoxicillin-clavulanate (AUGMENTIN) 875-125 MG tablet Take 1 tablet by mouth every 12 (twelve) hours for 10 days. 12/12/20 12/22/20 Yes Becky Augusta, NP  levonorgestrel [facility]) 19.5 MG IUD by Intrauterine route.   Yes [provider]  lidocaine (XYLOCAINE) 2 % solution Use as directed 15 mLs in the mouth or throat as needed for mouth pain. 12/12/20  Yes Becky Augusta, NP  traMADol (ULTRAM) 50 MG tablet Take 1 tablet (50  mg total) by mouth every 6 (six) hours as needed. 12/12/20  Yes Becky Augusta, NP    Family History Family History  Problem Relation Age of Onset  . Healthy Mother   . Healthy Father     Social History Social History   Tobacco Use  . Smoking status: Never Smoker  . Smokeless tobacco: Never Used  Vaping Use  . Vaping Use: Never used  Substance Use Topics  . Alcohol use: Yes    Comment: socially  . Drug use: No     Allergies   Patient has no known allergies.   Review of Systems Review of Systems  Constitutional: Positive for fatigue and fever. Negative for activity change and appetite change.  HENT: Positive for sore throat. Negative for congestion, ear pain and rhinorrhea.   Respiratory: Negative for cough, shortness of breath and wheezing.   Gastrointestinal: Negative for abdominal pain, diarrhea, nausea and vomiting.  Skin: Negative for rash.  Hematological: Negative.   Psychiatric/Behavioral: Negative.      Physical Exam Triage Vital Signs ED Triage Vitals [12/12/20 1854]  Enc Vitals Group     BP      Pulse      Resp  Temp      Temp src      SpO2      Weight 180 lb (81.6 kg)     Height 5\' 6"  (1.676 m)     Head Circumference      Peak Flow      Pain Score 10     Pain Loc      Pain Edu?      Excl. in GC?    No data found.  Updated Vital Signs BP 111/83 (BP Location: Left Arm)   Pulse (!) 142   Temp 99.9 F (37.7 C) (Oral)   Resp 19   Ht 5\' 6"  (1.676 m)   Wt 180 lb (81.6 kg)   SpO2 100%   BMI 29.05 kg/m   Visual Acuity Right Eye Distance:   Left Eye Distance:   Bilateral Distance:    Right Eye Near:   Left Eye Near:    Bilateral Near:     Physical Exam Vitals and nursing note reviewed.  Constitutional:      General: She is not in acute distress.    Appearance: Normal appearance. She is normal weight. She is not ill-appearing.  HENT:     Head: Normocephalic and atraumatic.     Mouth/Throat:     Mouth: Mucous membranes are  moist.     Pharynx: Oropharyngeal exudate and posterior oropharyngeal erythema present.  Cardiovascular:     Rate and Rhythm: Normal rate and regular rhythm.     Pulses: Normal pulses.     Heart sounds: Normal heart sounds. No murmur heard. No gallop.   Pulmonary:     Effort: Pulmonary effort is normal.     Breath sounds: Normal breath sounds. No wheezing, rhonchi or rales.  Musculoskeletal:     Cervical back: Normal range of motion and neck supple.  Lymphadenopathy:     Cervical: Cervical adenopathy present.  Skin:    General: Skin is warm and dry.     Capillary Refill: Capillary refill takes less than 2 seconds.     Findings: No erythema or rash.  Neurological:     General: No focal deficit present.     Mental Status: She is alert and oriented to person, place, and time.  Psychiatric:        Mood and Affect: Mood normal.        Behavior: Behavior normal.        Thought Content: Thought content normal.        Judgment: Judgment normal.      UC Treatments / Results  Labs (all labs ordered are listed, but only abnormal results are displayed) Labs Reviewed  GROUP A STREP BY PCR  RESP PANEL BY RT-PCR (FLU A&B, COVID) [id number]  AEROBIC CULTURE W GRAM STAIN (SUPERFICIAL SPECIMEN)    EKG   Radiology No results found.  Procedures Procedures (including critical care time)  Medications Ordered in UC Medications - No data to display  Initial Impression / Assessment and Plan / UC Course  I have reviewed the triage vital signs and the nursing notes.  Pertinent labs & imaging results that were available during my care of the patient were reviewed by me and considered in my medical decision making (see chart for details).   Patient is a very pleasant yet ill-appearing 25 year old female here for evaluation of severe sore throat associated with fever, body aches, and fatigue that started yesterday.  Patient has a longstanding history of sore throats and strep throat but reports  that this is the worst her throat has ever felt.  She reports that she is able to take in sips of water only due to the severe pain in the back of her throat.  Patient denies any other upper or lower respiratory symptoms and she denies GI symptoms.  Physical exam reveals sinus tachycardia without ectopy and clear lung sounds in all fields.  Patient has bilateral shotty, anterior cervical lymphadenopathy.  Tonsillar pillars are erythematous and edematous with white and brown exudative matter on both tonsils.  Will order strep PCR and respiratory triplex panel.  Respiratory triplex panel is negative for COVID and flu.  Strep PCR is negative for strep A.  Will order aerobic culture of throat.  Suspect patient's severe pharyngitis is bacterial in origin.  Will cover with Augmentin while the culture is pending.  We will also prescribe viscous lidocaine and tramadol to help with pain.   Final Clinical Impressions(s) / UC Diagnoses   Final diagnoses:  Exudative tonsillitis     Discharge Instructions     Take the Augmentin twice daily with food for 10 days for treatment of your tonsillitis.  Use the viscous lidocaine, 15 mL 4 times a day as needed for pain relief.  Take over-the-counter ibuprofen according to the package instructions as needed for pain and fever.  Use the tramadol as needed for severe pain.  If you develop any new or worsening symptoms return for reevaluation or go to the ER.    ED Prescriptions    Medication Sig Dispense Auth. Provider   amoxicillin-clavulanate (AUGMENTIN) 875-125 MG tablet Take 1 tablet by mouth every 12 (twelve) hours for 10 days. 20 tablet Becky Augusta, NP   traMADol (ULTRAM) 50 MG tablet Take 1 tablet (50 mg total) by mouth every 6 (six) hours as needed. 15 tablet Becky Augusta, NP   lidocaine (XYLOCAINE) 2 % solution Use as directed 15 mLs in the mouth or throat as needed for mouth pain. 100 mL Becky Augusta, NP     I have reviewed the PDMP during  this encounter.   Becky Augusta, NP 12/12/20 1950

## 2020-12-14 LAB — AEROBIC CULTURE W GRAM STAIN (SUPERFICIAL SPECIMEN): Special Requests: NORMAL

## 2020-12-15 LAB — AEROBIC CULTURE W GRAM STAIN (SUPERFICIAL SPECIMEN): Culture: NORMAL
